# Patient Record
Sex: Male | Born: 2000 | Race: White | Hispanic: No | Marital: Single | Smoking: Never smoker
Health system: Southern US, Community
[De-identification: ages and names within clinical notes are randomized; demographics above are authoritative.]

## PROBLEM LIST (undated history)

## (undated) DIAGNOSIS — K208 Other esophagitis without bleeding: Secondary | ICD-10-CM

## (undated) DIAGNOSIS — T50905A Adverse effect of unspecified drugs, medicaments and biological substances, initial encounter: Secondary | ICD-10-CM

## (undated) DIAGNOSIS — Q677 Pectus carinatum: Secondary | ICD-10-CM

## (undated) HISTORY — PX: NO PAST SURGERIES: SHX2092

## (undated) HISTORY — PX: TESTICLE SURGERY: SHX794

## (undated) HISTORY — PX: WISDOM TOOTH EXTRACTION: SHX21

## (undated) HISTORY — DX: Other esophagitis without bleeding: K20.80

## (undated) HISTORY — DX: Adverse effect of unspecified drugs, medicaments and biological substances, initial encounter: T50.905A

---
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
## 2019-08-13 DIAGNOSIS — H5203 Hypermetropia, bilateral: Secondary | ICD-10-CM | POA: Diagnosis not present

## 2019-10-08 ENCOUNTER — Other Ambulatory Visit: Payer: Self-pay

## 2019-10-08 ENCOUNTER — Ambulatory Visit (INDEPENDENT_AMBULATORY_CARE_PROVIDER_SITE_OTHER): Payer: BC Managed Care – PPO | Admitting: Internal Medicine

## 2019-10-08 ENCOUNTER — Encounter: Payer: Self-pay | Admitting: Internal Medicine

## 2019-10-08 VITALS — Ht 76.0 in | Wt 160.0 lb

## 2019-10-08 DIAGNOSIS — Z1329 Encounter for screening for other suspected endocrine disorder: Secondary | ICD-10-CM | POA: Diagnosis not present

## 2019-10-08 DIAGNOSIS — Z0184 Encounter for antibody response examination: Secondary | ICD-10-CM

## 2019-10-08 DIAGNOSIS — Z1159 Encounter for screening for other viral diseases: Secondary | ICD-10-CM

## 2019-10-08 DIAGNOSIS — E559 Vitamin D deficiency, unspecified: Secondary | ICD-10-CM | POA: Diagnosis not present

## 2019-10-08 DIAGNOSIS — Z1389 Encounter for screening for other disorder: Secondary | ICD-10-CM

## 2019-10-08 DIAGNOSIS — Z20828 Contact with and (suspected) exposure to other viral communicable diseases: Secondary | ICD-10-CM

## 2019-10-08 DIAGNOSIS — Z0000 Encounter for general adult medical examination without abnormal findings: Secondary | ICD-10-CM | POA: Diagnosis not present

## 2019-10-08 DIAGNOSIS — Z20822 Contact with and (suspected) exposure to covid-19: Secondary | ICD-10-CM

## 2019-10-08 NOTE — Progress Notes (Signed)
Virtual Visit via Video Note  I connected with Clayton Morales  on 10/08/19 at  3:30 PM EST by a video enabled telemedicine application and verified that I am speaking with the correct person using two identifiers.  Location patient: home Location provider:work or home office Persons participating in the virtual visit: patient, provider  I discussed the limitations of evaluation and management by telemedicine and the availability of in person appointments. The patient expressed understanding and agreed to proceed.   HPI: 1. Annual doing well c/w covid exposure [facility] 05/2019 and wants Abx testing no current sx's     ROS: See pertinent positives and negatives per HPI. General: wt stable  HEENT: normal hearing  CV: no chest pain  Lungs: no sob  GI: no ab pain  GU: no issues MSK: no jt pain  Skin: no issues   Neuro: no h/a  Psych: normal mood/anxiety  Past Medical History:  Diagnosis Date  . 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 Pill esophagitis    ? doxycycline in 06/2018 to to infection was on Abx    Past Surgical History:  Procedure Laterality Date  . ESOPHAGOGASTRODUODENOSCOPY (EGD) WITH PROPOFOL N/A 06/29/2018   Procedure: ESOPHAGOGASTRODUODENOSCOPY (EGD) WITH PROPOFOL;  Surgeon: Manya Silvas, MD;  Location: [facility] ENDOSCOPY;  Service: Endoscopy;  Laterality: N/A;  . TESTICLE SURGERY     right childhood  . WISDOM TOOTH EXTRACTION      History reviewed. No pertinent family history.  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  EXAM:  VITALS per patient if applicable:  GENERAL: alert, oriented, appears well and in no acute distress  HEENT: atraumatic, conjunttiva  clear, no obvious abnormalities on inspection of external nose and ears  NECK: normal movements of the head and neck  LUNGS: on inspection no signs of respiratory distress, breathing rate appears normal, no obvious gross SOB, gasping or wheezing  CV: no obvious cyanosis  MS: moves all visible extremities without noticeable abnormality  PSYCH/NEURO: pleasant and cooperative, no obvious depression or anxiety, speech and thought processing grossly intact  ASSESSMENT AND PLAN:  Discussed the following assessment and plan:  Annual physical exam - Plan: Comprehensive metabolic panel, CBC with Differential/Platelet Flu shot utd  Tdap due 05/09/2021  hpv vaccine had  MMR and hep B labs  rec responsible choices, healthy diet and exercise  EGD 06/29/18 esophagitis [facility] Dr. Tiffany Kocher due to tetracycline was on ppi and carafate short term  Pharmacy [facility] [address]  sch nonfasting labs   -we discussed possible serious and likely etiologies, options for evaluation and workup, limitations of telemedicine visit vs in person visit, treatment, treatment risks and precautions. Pt prefers to treat via telemedicine empirically rather then risking or undertaking an in person visit at this moment. Patient agrees to seek prompt in person care if worsening, new symptoms arise, or if is not improving with treatment.   I discussed the assessment and treatment plan with the patient. The patient was provided an opportunity to ask questions and all were answered. The patient agreed with the plan and demonstrated an understanding of the instructions.   The patient was advised to call back or seek an in-person  evaluation if the symptoms worsen or if the condition fails to improve as anticipated.  Time spent 20 minutes  Delorise Jackson, MD

## 2019-10-09 NOTE — Patient Instructions (Signed)
Nice to meet you today  Happy Holidays

## 2019-10-17 ENCOUNTER — Other Ambulatory Visit (INDEPENDENT_AMBULATORY_CARE_PROVIDER_SITE_OTHER): Payer: BC Managed Care – PPO

## 2019-10-17 ENCOUNTER — Other Ambulatory Visit: Payer: Self-pay

## 2019-10-17 DIAGNOSIS — Z1159 Encounter for screening for other viral diseases: Secondary | ICD-10-CM | POA: Diagnosis not present

## 2019-10-17 DIAGNOSIS — Z1389 Encounter for screening for other disorder: Secondary | ICD-10-CM

## 2019-10-17 DIAGNOSIS — E559 Vitamin D deficiency, unspecified: Secondary | ICD-10-CM | POA: Diagnosis not present

## 2019-10-17 DIAGNOSIS — Z1329 Encounter for screening for other suspected endocrine disorder: Secondary | ICD-10-CM

## 2019-10-17 DIAGNOSIS — Z20822 Contact with and (suspected) exposure to covid-19: Secondary | ICD-10-CM

## 2019-10-17 DIAGNOSIS — Z20828 Contact with and (suspected) exposure to other viral communicable diseases: Secondary | ICD-10-CM | POA: Diagnosis not present

## 2019-10-17 DIAGNOSIS — Z0184 Encounter for antibody response examination: Secondary | ICD-10-CM | POA: Diagnosis not present

## 2019-10-17 DIAGNOSIS — Z0000 Encounter for general adult medical examination without abnormal findings: Secondary | ICD-10-CM

## 2019-10-17 LAB — CBC WITH DIFFERENTIAL/PLATELET
Basophils Absolute: 0.1 10*3/uL (ref 0.0–0.1)
Basophils Relative: 1.2 % (ref 0.0–3.0)
Eosinophils Absolute: 0.3 10*3/uL (ref 0.0–0.7)
Eosinophils Relative: 5.4 % — ABNORMAL HIGH (ref 0.0–5.0)
HCT: 46.9 % (ref 36.0–49.0)
Hemoglobin: 15.9 g/dL (ref 12.0–16.0)
Lymphocytes Relative: 36.3 % (ref 24.0–48.0)
Lymphs Abs: 1.7 10*3/uL (ref 0.7–4.0)
MCHC: 33.9 g/dL (ref 31.0–37.0)
MCV: 89.7 fl (ref 78.0–98.0)
Monocytes Absolute: 0.4 10*3/uL (ref 0.1–1.0)
Monocytes Relative: 7.8 % (ref 3.0–12.0)
Neutro Abs: 2.3 10*3/uL (ref 1.4–7.7)
Neutrophils Relative %: 49.3 % (ref 43.0–71.0)
Platelets: 168 10*3/uL (ref 150.0–575.0)
RBC: 5.23 Mil/uL (ref 3.80–5.70)
RDW: 13 % (ref 11.4–15.5)
WBC: 4.8 10*3/uL (ref 4.5–13.5)

## 2019-10-17 LAB — COMPREHENSIVE METABOLIC PANEL
ALT: 25 U/L (ref 0–53)
AST: 20 U/L (ref 0–37)
Albumin: 4.7 g/dL (ref 3.5–5.2)
Alkaline Phosphatase: 61 U/L (ref 52–171)
BUN: 15 mg/dL (ref 6–23)
CO2: 30 mEq/L (ref 19–32)
Calcium: 9.8 mg/dL (ref 8.4–10.5)
Chloride: 104 mEq/L (ref 96–112)
Creatinine, Ser: 0.98 mg/dL (ref 0.40–1.50)
GFR: 97.96 mL/min (ref >60.00)
Glucose, Bld: 90 mg/dL (ref 70–99)
Potassium: 4 mEq/L (ref 3.5–5.1)
Sodium: 140 mEq/L (ref 135–145)
Total Bilirubin: 0.7 mg/dL (ref 0.2–1.2)
Total Protein: 6.7 g/dL (ref 6.0–8.3)

## 2019-10-17 LAB — TSH: TSH: 1.46 u[IU]/mL (ref 0.40–5.00)

## 2019-10-17 LAB — T4, FREE: Free T4: 0.81 ng/dL (ref 0.60–1.60)

## 2019-10-17 LAB — VITAMIN D 25 HYDROXY (VIT D DEFICIENCY, FRACTURES): VITD: 26.62 ng/mL — ABNORMAL LOW (ref 30.00–100.00)

## 2019-10-17 NOTE — Addendum Note (Signed)
Addended by: Zannie Cove on: 10/17/2019 12:15 PM   Modules accepted: Orders

## 2019-10-20 LAB — SARS-COV-2 ANTIBODY, IGM: SARS-CoV-2 Antibody, IgM: NEGATIVE

## 2019-10-20 LAB — SAR COV2 SEROLOGY (COVID19)AB(IGG),IA: DiaSorin SARS-CoV-2 Ab, IgG: POSITIVE — AB

## 2019-10-21 LAB — MEASLES/MUMPS/RUBELLA IMMUNITY
Mumps IgG: 133 AU/mL
Rubella: 2.2 Index
Rubeola IgG: 219 AU/mL

## 2019-10-21 LAB — HEPATITIS B SURFACE ANTIBODY, QUANTITATIVE: Hepatitis B-Post: <5 m[IU]/mL — ABNORMAL LOW (ref >=10)

## 2019-11-05 ENCOUNTER — Telehealth: Payer: Self-pay | Admitting: Internal Medicine

## 2019-11-05 NOTE — Telephone Encounter (Signed)
Pt

## 2020-01-22 DIAGNOSIS — Z23 Encounter for immunization: Secondary | ICD-10-CM | POA: Diagnosis not present

## 2020-02-12 DIAGNOSIS — Z23 Encounter for immunization: Secondary | ICD-10-CM | POA: Diagnosis not present

## 2020-08-14 DIAGNOSIS — H5203 Hypermetropia, bilateral: Secondary | ICD-10-CM | POA: Diagnosis not present

## 2020-08-14 DIAGNOSIS — H52203 Unspecified astigmatism, bilateral: Secondary | ICD-10-CM | POA: Diagnosis not present

## 2020-10-12 DIAGNOSIS — Z23 Encounter for immunization: Secondary | ICD-10-CM | POA: Diagnosis not present

## 2021-06-08 DIAGNOSIS — J029 Acute pharyngitis, unspecified: Secondary | ICD-10-CM | POA: Diagnosis not present

## 2021-06-08 DIAGNOSIS — R0989 Other specified symptoms and signs involving the circulatory and respiratory systems: Secondary | ICD-10-CM | POA: Diagnosis not present

## 2021-06-08 DIAGNOSIS — R509 Fever, unspecified: Secondary | ICD-10-CM | POA: Diagnosis not present

## 2021-07-14 DIAGNOSIS — Z23 Encounter for immunization: Secondary | ICD-10-CM | POA: Diagnosis not present

## 2021-08-09 DIAGNOSIS — R509 Fever, unspecified: Secondary | ICD-10-CM | POA: Diagnosis not present

## 2021-08-09 DIAGNOSIS — R52 Pain, unspecified: Secondary | ICD-10-CM | POA: Diagnosis not present

## 2021-08-11 DIAGNOSIS — R0981 Nasal congestion: Secondary | ICD-10-CM | POA: Diagnosis not present

## 2021-08-11 DIAGNOSIS — R509 Fever, unspecified: Secondary | ICD-10-CM | POA: Diagnosis not present

## 2021-08-11 DIAGNOSIS — R052 Subacute cough: Secondary | ICD-10-CM | POA: Diagnosis not present

## 2021-08-13 DIAGNOSIS — R5383 Other fatigue: Secondary | ICD-10-CM | POA: Diagnosis not present

## 2021-08-13 DIAGNOSIS — R509 Fever, unspecified: Secondary | ICD-10-CM | POA: Diagnosis not present

## 2021-08-15 DIAGNOSIS — R002 Palpitations: Secondary | ICD-10-CM | POA: Diagnosis not present

## 2021-08-15 DIAGNOSIS — I4510 Unspecified right bundle-branch block: Secondary | ICD-10-CM | POA: Diagnosis not present

## 2021-08-15 DIAGNOSIS — B2790 Infectious mononucleosis, unspecified without complication: Secondary | ICD-10-CM | POA: Diagnosis not present

## 2021-08-15 DIAGNOSIS — R9431 Abnormal electrocardiogram [ECG] [EKG]: Secondary | ICD-10-CM | POA: Diagnosis not present

## 2021-08-15 DIAGNOSIS — R059 Cough, unspecified: Secondary | ICD-10-CM | POA: Diagnosis not present

## 2021-08-15 DIAGNOSIS — R509 Fever, unspecified: Secondary | ICD-10-CM | POA: Diagnosis not present

## 2021-08-15 DIAGNOSIS — R7401 Elevation of levels of liver transaminase levels: Secondary | ICD-10-CM | POA: Diagnosis not present

## 2021-08-15 DIAGNOSIS — R000 Tachycardia, unspecified: Secondary | ICD-10-CM | POA: Diagnosis not present

## 2021-08-15 DIAGNOSIS — Z20822 Contact with and (suspected) exposure to covid-19: Secondary | ICD-10-CM | POA: Diagnosis not present

## 2021-08-16 DIAGNOSIS — R059 Cough, unspecified: Secondary | ICD-10-CM | POA: Diagnosis not present

## 2021-08-16 DIAGNOSIS — B2790 Infectious mononucleosis, unspecified without complication: Secondary | ICD-10-CM | POA: Diagnosis not present

## 2021-08-17 DIAGNOSIS — B2790 Infectious mononucleosis, unspecified without complication: Secondary | ICD-10-CM | POA: Diagnosis not present

## 2021-08-17 DIAGNOSIS — R509 Fever, unspecified: Secondary | ICD-10-CM | POA: Diagnosis not present

## 2021-08-17 DIAGNOSIS — R161 Splenomegaly, not elsewhere classified: Secondary | ICD-10-CM | POA: Diagnosis not present

## 2021-08-17 DIAGNOSIS — B340 Adenovirus infection, unspecified: Secondary | ICD-10-CM | POA: Diagnosis not present

## 2021-08-17 DIAGNOSIS — R000 Tachycardia, unspecified: Secondary | ICD-10-CM | POA: Diagnosis not present

## 2021-08-17 DIAGNOSIS — B970 Adenovirus as the cause of diseases classified elsewhere: Secondary | ICD-10-CM | POA: Diagnosis not present

## 2021-08-17 DIAGNOSIS — R0682 Tachypnea, not elsewhere classified: Secondary | ICD-10-CM | POA: Diagnosis not present

## 2021-08-17 DIAGNOSIS — M791 Myalgia, unspecified site: Secondary | ICD-10-CM | POA: Diagnosis not present

## 2021-08-17 DIAGNOSIS — Z792 Long term (current) use of antibiotics: Secondary | ICD-10-CM | POA: Diagnosis not present

## 2021-08-26 DIAGNOSIS — D696 Thrombocytopenia, unspecified: Secondary | ICD-10-CM | POA: Diagnosis not present

## 2021-08-26 DIAGNOSIS — R748 Abnormal levels of other serum enzymes: Secondary | ICD-10-CM | POA: Diagnosis not present
